# Patient Record
Sex: Female | Born: 1970 | Race: White | Hispanic: No | Marital: Married | Smoking: Never smoker
Health system: Southern US, Community
[De-identification: ages and names within clinical notes are randomized; demographics above are authoritative.]

## PROBLEM LIST (undated history)

## (undated) DIAGNOSIS — T7840XA Allergy, unspecified, initial encounter: Secondary | ICD-10-CM

## (undated) DIAGNOSIS — J45909 Unspecified asthma, uncomplicated: Secondary | ICD-10-CM

## (undated) DIAGNOSIS — F32A Depression, unspecified: Secondary | ICD-10-CM

## (undated) DIAGNOSIS — F329 Major depressive disorder, single episode, unspecified: Secondary | ICD-10-CM

## (undated) DIAGNOSIS — L209 Atopic dermatitis, unspecified: Secondary | ICD-10-CM

## (undated) DIAGNOSIS — F419 Anxiety disorder, unspecified: Secondary | ICD-10-CM

## (undated) HISTORY — DX: Anxiety disorder, unspecified: F41.9

## (undated) HISTORY — DX: Depression, unspecified: F32.A

## (undated) HISTORY — DX: Allergy, unspecified, initial encounter: T78.40XA

## (undated) HISTORY — DX: Atopic dermatitis, unspecified: L20.9

## (undated) HISTORY — DX: Unspecified asthma, uncomplicated: J45.909

---
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
## 1998-08-09 ENCOUNTER — Other Ambulatory Visit: Admission: RE | Admit: 1998-08-09 | Discharge: 1998-08-09 | Payer: Self-pay | Admitting: *Deleted

## 1999-07-29 ENCOUNTER — Other Ambulatory Visit: Admission: RE | Admit: 1999-07-29 | Discharge: 1999-07-29 | Payer: Self-pay | Admitting: *Deleted

## 2000-08-04 ENCOUNTER — Other Ambulatory Visit: Admission: RE | Admit: 2000-08-04 | Discharge: 2000-08-04 | Payer: Self-pay | Admitting: *Deleted

## 2000-12-10 ENCOUNTER — Encounter: Payer: Self-pay | Admitting: Obstetrics and Gynecology

## 2000-12-10 ENCOUNTER — Ambulatory Visit (HOSPITAL_COMMUNITY): Admission: RE | Admit: 2000-12-10 | Discharge: 2000-12-10 | Payer: Self-pay | Admitting: Obstetrics and Gynecology

## 2001-05-16 ENCOUNTER — Inpatient Hospital Stay: Admission: AD | Admit: 2001-05-16 | Discharge: 2001-05-16 | Payer: Self-pay | Admitting: Obstetrics and Gynecology

## 2001-05-21 ENCOUNTER — Inpatient Hospital Stay (HOSPITAL_COMMUNITY): Admission: AD | Admit: 2001-05-21 | Discharge: 2001-05-25 | Payer: Self-pay | Admitting: Obstetrics & Gynecology

## 2001-05-26 ENCOUNTER — Encounter: Admission: RE | Admit: 2001-05-26 | Discharge: 2001-06-25 | Payer: Self-pay | Admitting: Obstetrics and Gynecology

## 2001-06-28 ENCOUNTER — Other Ambulatory Visit: Admission: RE | Admit: 2001-06-28 | Discharge: 2001-06-28 | Payer: Self-pay | Admitting: Obstetrics and Gynecology

## 2002-10-03 ENCOUNTER — Encounter: Payer: Self-pay | Admitting: Internal Medicine

## 2002-10-03 ENCOUNTER — Encounter: Admission: RE | Admit: 2002-10-03 | Discharge: 2002-10-03 | Payer: Self-pay | Admitting: Internal Medicine

## 2003-02-15 ENCOUNTER — Other Ambulatory Visit: Admission: RE | Admit: 2003-02-15 | Discharge: 2003-02-15 | Payer: Self-pay | Admitting: Obstetrics and Gynecology

## 2003-09-13 ENCOUNTER — Other Ambulatory Visit: Admission: RE | Admit: 2003-09-13 | Discharge: 2003-09-13 | Payer: Self-pay | Admitting: Obstetrics and Gynecology

## 2004-02-16 ENCOUNTER — Ambulatory Visit (HOSPITAL_COMMUNITY): Admission: RE | Admit: 2004-02-16 | Discharge: 2004-02-16 | Payer: Self-pay | Admitting: Obstetrics & Gynecology

## 2004-04-17 ENCOUNTER — Encounter (INDEPENDENT_AMBULATORY_CARE_PROVIDER_SITE_OTHER): Payer: Self-pay | Admitting: *Deleted

## 2004-04-17 ENCOUNTER — Inpatient Hospital Stay (HOSPITAL_COMMUNITY): Admission: AD | Admit: 2004-04-17 | Discharge: 2004-04-20 | Payer: Self-pay | Admitting: Obstetrics and Gynecology

## 2004-05-17 ENCOUNTER — Other Ambulatory Visit: Admission: RE | Admit: 2004-05-17 | Discharge: 2004-05-17 | Payer: Self-pay | Admitting: Obstetrics and Gynecology

## 2010-07-23 ENCOUNTER — Encounter: Admission: RE | Admit: 2010-07-23 | Discharge: 2010-07-23 | Payer: Self-pay | Admitting: *Deleted

## 2011-01-05 ENCOUNTER — Encounter: Payer: Self-pay | Admitting: Obstetrics and Gynecology

## 2011-05-02 NOTE — H&P (Signed)
[facility] of [facility]  Patient:    Jacqueline Lane, Jacqueline Lane                  MRN: [id number] Adm. Date:  [id number] Attending:  Genia Del                         History and Physical  DATE OF BIRTH:                [birth year]  HISTORY:                      Mrs. Jacqueline Lane is a 40 year old G1 at 80 weeks and 4 days gestation.  Last menstrual period did not correspond with ultrasound. Dating by ultrasound expected date of delivery May 31, 2001.  REASON FOR ADMISSION:         Fluid leak at 3:15 a.m. on May 21, 2001.  HISTORY OF PRESENT ILLNESS:   Patient felt a large amount of clear fluid vaginally at 3:15 a.m. and that continued thereafter.  She felt mild irregular uterine contractions.  Fetal movements positive.  No vaginal bleeding.  No PIH symptoms.  PAST MEDICAL HISTORY:         Negative.  PAST SURGICAL HISTORY:        Negative.  PAST GYNECOLOGICAL HISTORY:   Negative.  FAMILY HISTORY:               Noncontributory.  MEDICATIONS:                  Prenate vitamins.  ALLERGIES:                    No known drug allergies.  SOCIAL HISTORY:               Married.  Nonsmoker.  HISTORY OF PRESENT PREGNANCY: First trimester was normal.  PRENATAL LABORATORIES:        Hemoglobin 13.3, platelets 271,000.  Blood group B+.  Rh antibody negative.  Toxo negative.  RPR negative.  HBSAG negative. HIV negative.  Rubella immune.  Pap test within normal limits.  Gonorrhea and chlamydia negative.  In the second trimester she had a triple test within normal limits.  In the third trimester a one hour GTT was within normal limits at 28 weeks.  She had a negative group B strep at 35 weeks.  Blood pressures remained normal throughout pregnancy.                                At 20+ weeks she had a review of anatomy that was within normal limits.  Normal placenta and fluids.  An ultrasound was repeated at 37+ weeks showing an estimated fetal weight at the  54th percentile.  Amniotic fluid was at the 93rd percentile.  Dopplers were normal.  REVIEW OF SYSTEMS:            CONSTITUTIONAL:  Negative.  HEENT:  Negative. RESPIRATORY:  Negative.  CARDIOVASCULAR:  Negative.  GENITOURINARY:  Negative. GASTROINTESTINAL:  Negative.  DERMATOLOGIC:  Negative.  NEUROLOGIC:  Negative. ENDOCRINOLOGIC:  Negative.  PHYSICAL EXAMINATION  GENERAL:                      No apparent distress.  VITAL SIGNS:  Blood pressure on admission 143/79, then 141/66, pulse 56, temperature 97.9, respiratory rate normal and regular.  LUNGS:                        Clear.  CARDIAC:                      Regular rhythm, no murmur.  ABDOMEN:                      Gravid.  Uterine height corresponding.  Cephalic presentation.  PELVIC:                       Vaginal examination by nurse showed a fingertip cervix, vertex, high station.  Clear fluid with positive fern.  EXTREMITIES:                  Lower limbs:  No edema.  LABORATORIES:                 Monitoring:  Fetal heart rate 140-150 with good variability, no decelerations.  Uterine contractions were irregular and mild.  IMPRESSION:                   G1 at 38 weeks and 4 days gestation with spontaneous rupture of membranes with no active labor currently.  Fetal well-being reassuring.  Group B strep negative.  PLAN:                         Admit to labor and delivery.  Start Pitocin induction.  Monitoring.  Ampicillin IV starting at 3:15 p.m.  Expectant management towards probable vaginal delivery. DD:  05/21/01 TD:  05/21/01 Job: [id number]

## 2011-05-02 NOTE — Op Note (Signed)
[facility] of [facility]  Patient:    Jacqueline Lane, Jacqueline Lane                  MRN: [id number] Proc. Date: 05/22/01 Adm. Date:  [id number] Attending:  Genia Del                           Operative Report  PREOPERATIVE DIAGNOSES:       1. Intrauterine pregnancy at 38 weeks 5 days.                               2. Prolonged rupture of membranes.                               3. Failure of induction.  POSTOPERATIVE DIAGNOSES:      1. Intrauterine pregnancy at 38 weeks 5 days.                               2. Prolonged rupture of membranes.                               3. Failure of induction.  PROCEDURE:                    Primary low transverse cesarean section.  SURGEON:                      Silverio Lay, M.D.  ANESTHESIA:                   Epidural.  ESTIMATED BLOOD LOSS:         600 cc.  DESCRIPTION OF PROCEDURE:     After being informed of the planned procedure with possible complications including bleeding, infection, injury to bowels, bladder, and ureters, informed consent is obtained. The patient is taken to OR#1 and preexisting epidural is reinforced. She is placed in the dorsal decubitus position with pelvis tilted to the left, prepped and draped in a sterile fashion, and a Foley catheter was already in place. After assessing adequate level of anesthesia, skin, subcutaneous tissue, and fat were incised in a Pfannenstiel fashion using knife. The fascia is incised in a transverse fashion. Linea alba is dissected. Peritoneum is entered in a midline fashion. Visceral peritoneum is then incised in the low transverse fashion allowing [place] to develop bladder flap and safely retract bladder. We can then safely enter myometrium in a low transverse fashion, first with knife, then in a blunt dissection. Amniotic fluid is clear. We assist the birth of a female infant at 10:31 a.m. in a vertex right ROA presentation. Mouth and nose are suctioned. Cord is  clamped with two Kelly clamps. Cord is sectioned and baby is given to pediatrician present in the room. Twenty cc of blood is drawn from the umbilical vein and we await the spontaneous delivery of placenta which is complete and cord has three vessels. Uterine revision is negative. We then proceed with closure of the hysterotomy in two layers, first with a running lock suture of 0 Vicryl, then with a Lemberts suture of 0 Vicryl covering the first one. We verify hemostasis which is adequate. We then clean both paracolic gutters, assess both tubes and  ovaries which are normal, and reassess hemostasis which is still adequate. Under fascia hemostasis is completed with cautery and fascia is closed with two running sutures of 0 Vicryl meeting midline. Fat is irrigated with warm saline. Hemostasis is completed with cautery. Incision is infiltrated with 10 cc of Marcaine 0.25% and skin is closed with staples.  Instruments and sponge count is complete x 2. Estimated blood loss 600 cc. The baby weighs 7 pounds 14 ounces and received Apgars of 9 at one minute and 9 at five minutes. The procedure is very well tolerated by the patient who is taken to recovery room in a well and stable condition. DD:  05/22/01 TD:  05/22/01 Job: [id number]

## 2011-05-02 NOTE — H&P (Signed)
NAME:  Jacqueline Lane, Jacqueline Lane NO.:  [id number]   MEDICAL RECORD NO.:  [id number]                   PATIENT TYPE:  INP   LOCATION:  9171                                 FACILITY:  [facility]   PHYSICIAN:  Lenoard Aden, M.D.             DATE OF BIRTH:  Jun 11, 1971   DATE OF ADMISSION:  04/17/2004  DATE OF DISCHARGE:                                HISTORY & PHYSICAL   CHIEF COMPLAINT:  Spontaneous rupture of membranes at 6:45 a.m.   HISTORY OF PRESENT ILLNESS:  The patient is a 40 year old, white female, G3,  P1, EDD of Apr 18, 2004, at 39-6/7 weeks who presents with spontaneous  rupture of thin meconium fluid for attempted VBAC.   ALLERGIES:  No known drug allergies.   MEDICATIONS:  Prenatal vitamins.   PAST OBSTETRICAL HISTORY:  C-section at 38 weeks for a 7 pound 13 ounce female  in 2002.  A four-week SAB in 2004.   PAST MEDICAL HISTORY:  History of C-section with no other medical or  surgical hospitalizations.   FAMILY HISTORY:  Prostate cancer.  Skin cancer.  Depression.  Heart disease.   PRENATAL LABORATORY DATA:  Blood type B positive.  Rh antibody negative.  Rubella immune.  Hepatitis and HIV negative.  GBS reported to be negative.   PHYSICAL EXAMINATION:  GENERAL:  Well-developed, well-nourished, white  female in no acute distress.  HEENT:  Normal.  LUNGS:  Clear.  HEART:  Regular rate and rhythm.  ABDOMEN:  Soft, gravid and nontender.  Estimated fetal weight 7-1/2 pounds.  PELVIC:  Cervix 2 cm, 90% vertex and -1.  Thin meconium stained fluid noted.  EXTREMITIES:  No cords.  NEUROLOGIC:  Nonfocal.   IMPRESSION:  Term intrauterine pregnancy with previous cesarean section for  attempted vaginal birth after cesarean.   PLAN:  The risks and benefits were discussed with a 1-3% risk of uterine  rupture noted, possibility of Pitocin augmentation noted, meconium rupture  of membranes with possibility of meconium aspiration discussed.  The patient  acknowledges and wishes to proceed with attempt of VBAC.  Will proceed with  amnioinfusion and anticipate attempts at vaginal delivery.                                              Lenoard Aden, M.D.   RJT/MEDQ  D:  04/17/2004  T:  04/17/2004  Job:  [id number]

## 2011-05-02 NOTE — Discharge Summary (Signed)
[facility] of [facility]  Patient:    Jacqueline Lane, Jacqueline Lane                  MRN: [id number] Adm. Date:  [id number] Disc. Date: [id number] Attending:  Genia Del                           Discharge Summary  REASON FOR ADMISSION:         Intrauterine pregnancy at 38 weeks and 4 days, reporting spontaneous rupture of membranes.  HISTORY OF PRESENT ILLNESS:   This is a 40 year old gravida 1 at 38 weeks and 4 days with spontaneous rupture of membranes who was started on Pitocin for a lack of labor.  Her cervix at the beginning of Pitocin was 1 cm, 95%, vertex, -2.  Twelve hours after spontaneous rupture of membranes her vaginal examination revealed a 3 cm cervix, 95% effacement, vertex, -1.  An intrauterine pressure catheter was inserted at 4 p.m.  Pitocin was increased until 36 milliunits/minute with adequate [place] units between 170 and 200 with no change on vaginal examination.  Trial of labor persisted for 24 hours with attempt to reduce the dose of Pitocin and reaugment, always to maintain MVUs above 150 and her cervical examination by May 22, 2001 at 9 a.m. remained unchanged at 3 cm, 95%, vertex, -1.  We concluded to failure of induction and patient was consented for primary low transverse cesarean section.  She underwent primary low transverse cesarean section for the birth of a female infant born at 10:31 on May 22, 2001 with Apgars 9 and 9 weighing 7 pounds 14 ounces without any complication and an estimated blood loss of 600 cc.  Her postoperative course was uneventful and she received her discharge home on May 25, 2001 or postoperative day #3 in a well and stable condition.  She was given Motrin and Tylox for pain control and was asked to come back in the office in four to six weeks for her postpartum examination.  She was instructed to call if experiencing increased pain, increased bleeding, or fever.  FINAL DIAGNOSES:              Intrauterine  pregnancy at 38 weeks and 5 days, failure of induction, prolonged rupture of membranes, status post primary low transverse cesarean section.  CONDITION ON DISCHARGE:       Well and stable.DD:  06/21/01 TD:  06/21/01 Job: [id number]

## 2011-05-02 NOTE — Op Note (Signed)
NAME:  Jacqueline Lane, Jacqueline Lane                     ACCOUNT NO.:  [id number]   MEDICAL RECORD NO.:  [id number]                   PATIENT TYPE:  INP   LOCATION:  9147                                 FACILITY:  [facility]   PHYSICIAN:  Gerri Spore B. Earlene Plater, M.D.               DATE OF BIRTH:  12/22/1970   DATE OF PROCEDURE:  04/17/2004  DATE OF DISCHARGE:                                 OPERATIVE REPORT   PREOPERATIVE DIAGNOSIS:  Previous cesarean section, attempting vaginal birth  after cesarean section with failure to progress beyond 8 cm.   POSTOPERATIVE DIAGNOSIS:  Previous cesarean section, attempting vaginal  birth after cesarean section with failure to progress beyond 8 cm.   PROCEDURE:  Repeat low transverse cesarean section.   SURGEON:  Chester Holstein. Earlene Plater, M.D.   ASSISTANT:  Richardean Sale, M.D.   FINDINGS:  Significant adhesions of the bladder to the posterior fascial  sheath, viable female infant weight 8 pounds 8 ounces, LOT position, Apgars  8 and 9, arterial pH 7.29, and meconium stained fluid.   ESTIMATED BLOOD LOSS:  700 mL.   FLUIDS:  2000.   URINE:  275.   COMPLICATIONS:  None.   INDICATIONS FOR PROCEDURE:  The patient presented in spontaneous labor with  history of previous cesarean section for failure to progress beyond 3 cm.  She was also noted to have meconium stained at amniotomy.  She had been  progressing well until 8 cm.  It did not change essentially beyond this for  greater than four hours despite adequate contractions on IUPC.  I therefore  recommended C-section.   DESCRIPTION OF PROCEDURE:  The patient taken to the operating room with  epidural anesthesia in place.  She was prepped and draped in standard  fashion and the bladder was being drained with a Foley catheter.  Incision  was made through the previous Pfannenstiel incision and carried sharply to  the fascia.  The fascia was divided sharply and extended laterally with Mayo  scissors.  Kocher clamps used to  elevate the superior aspect of the  incision.  The underlying rectus muscles were dissected off sharply.  Peritoneum approached in similar fashion.  Midline was identified and  divided sharply.  There was obviously bladder adherent to the posterior  sheath and cystotomy had not occurred.  The dissection of the superior  aspect of the fascia was continued cephalad another 2 to 3 cm to get clear  of the bladder adhesions.  The posterior sheath and peritoneum were then  entered sharply.  the bladder flap was then created with sharp and blunt  technique after taking down the adhesions to the uterus.  The rectus muscles  were divided in their medial third to provide additional room for delivery  as the abdomen was still quite tight.   Bladder blade was inserted and bladder flap created sharply.  The incision  was made in the uterus in low transverse fashion with  a knife and extended  laterally with bandage scissors.   The infant's head was delivered through the incision with the assistance of  a kiwi.  No pop-offs were encountered after it had been placed on the  midline in the flexion point from the mid green zone.  The nose and mouth  were suctioned with the DeLee and the remainder of the infant was delivered  without difficulty.  The cord was clamped and cut and the infant was handed  off to the awaiting pediatricians.  There was 1 g of Ancef given at the cord  clamp.   The placenta was removed manually and cleared of all clots and debris and  exteriorized.  The incision was free of extension.   The uterine incision was closed in a running locked fashion with 0 Monocryl.  There were some bleeders on the right lateral side which were made  hemostatic with figure-of-eight stitch of the same suture.   Uterus was returned to the abdomen and the pelvis irrigated.  The incision  was hemostatic as was the bladder flap and subfascial space.   The fascia was closed in a running stitch of 0  Vicryl.  The subcutaneous  tissue was irrigated and made hemostatic with the Bovie.  the skin weakness  closed with staples.   The patient tolerated the procedure well.  There were no complications.  She  was taken to the recovery room awake, alert and in stable condition.  All  counts correct per the operating room staff.                                               Gerri Spore B. Earlene Plater, M.D.    WBD/MEDQ  D:  04/17/2004  T:  04/18/2004  Job:  [id number]

## 2011-05-02 NOTE — Discharge Summary (Signed)
NAME:  Jacqueline Lane, Jacqueline Lane                     ACCOUNT NO.:  [id number]   MEDICAL RECORD NO.:  [id number]                   PATIENT TYPE:  INP   LOCATION:  9147                                 [facility]ACILITY[facility]   PHYSICIAN:  Gerri Spore B. Earlene Plater, M.D.               DATE OF BIRTH:  06-Jan-1971   DATE OF ADMISSION:  04/17/2004  DATE OF DISCHARGE:  04/20/2004                                 DISCHARGE SUMMARY   ADMISSION DIAGNOSES:  1. Forty-week intrauterine pregnancy.  2. Previous cesarean section, attempting vaginal birth after cesarean     section.  3. Failure to progress beyond 8 cm.   DISCHARGE DIAGNOSES:  1. Forty-week intrauterine pregnancy.  2. Previous cesarean section, attempting vaginal birth after cesarean     section.  3. Failure to progress beyond 8 cm.   PROCEDURE:  Repeat low transverse cesarean section.   HISTORY OF PRESENT ILLNESS:  The patient presented in spontaneous labor with  a history of a previous cesarean section for failure to progress beyond 3  cm.   HOSPITAL COURSE:  The patient was admitted in spontaneous labor and  progressed well to 8 cm.  There was no cervical change beyond 8 cm for  greater than 4 hours despite adequate contractions on IUPC.  I therefore  recommended cesarean section.   The patient was subsequently delivered by a repeat low transverse cesarean  section.  Findings at the time of surgery included significant adhesions of  the bladder to the posterior fascial sheath.  A viable female infant, weight  8 pounds 8 ounces, LOP position, Apgars 8 and 9, arterial cord pH 7.29, and  meconium-stained fluid.   Postoperatively the patient rapidly regained her ability to ambulate, void,  and tolerate a regular diet.  She was discharged home on postoperative day  #3.   DISCHARGE INSTRUCTIONS:  Standard preprinted instructions given prior to  dismissal.   DISCHARGE MEDICATIONS:  Tylox one to two p.o. q.4-6h. p.r.n. for pain.   FOLLOW-UP:  [facility] in 4-6 weeks.   DISPOSITION AT DISCHARGE:  Satisfactory.                                               Gerri Spore B. Earlene Plater, M.D.   WBD/MEDQ  D:  05/01/2004  T:  05/01/2004  Job:  [id number]

## 2020-03-19 ENCOUNTER — Encounter: Payer: Self-pay | Admitting: Internal Medicine

## 2020-04-06 ENCOUNTER — Other Ambulatory Visit: Payer: Self-pay

## 2020-04-06 ENCOUNTER — Ambulatory Visit (AMBULATORY_SURGERY_CENTER): Payer: Self-pay | Admitting: *Deleted

## 2020-04-06 VITALS — Temp 97.0°F | Ht 62.0 in | Wt 136.4 lb

## 2020-04-06 DIAGNOSIS — Z1211 Encounter for screening for malignant neoplasm of colon: Secondary | ICD-10-CM

## 2020-04-06 MED ORDER — SUTAB 1479-225-188 MG PO TABS: 1.0000 | ORAL_TABLET | ORAL | 0 refills | Status: DC

## 2020-04-06 NOTE — Progress Notes (Signed)
Patient denies any allergies to egg or soy products. Patient denies complications with anesthesia/sedation.  Patient denies oxygen use at home and denies diet medications. Emmi instructions for colonoscopy explained and given to patient.  Sutab coupon given at [facility] appt.  Patient had both covid vaccinations, last one on 03/31/20

## 2020-04-18 ENCOUNTER — Encounter: Payer: Self-pay | Admitting: Internal Medicine

## 2020-04-20 ENCOUNTER — Ambulatory Visit (AMBULATORY_SURGERY_CENTER): Payer: 59 | Admitting: Internal Medicine

## 2020-04-20 ENCOUNTER — Other Ambulatory Visit: Payer: Self-pay | Admitting: Internal Medicine

## 2020-04-20 ENCOUNTER — Encounter: Payer: Self-pay | Admitting: Internal Medicine

## 2020-04-20 ENCOUNTER — Other Ambulatory Visit: Payer: Self-pay

## 2020-04-20 VITALS — BP 115/64 | HR 49 | Temp 96.1°F | Resp 17 | Ht 62.0 in | Wt 136.0 lb

## 2020-04-20 DIAGNOSIS — Z1211 Encounter for screening for malignant neoplasm of colon: Secondary | ICD-10-CM | POA: Diagnosis present

## 2020-04-20 DIAGNOSIS — D124 Benign neoplasm of descending colon: Secondary | ICD-10-CM

## 2020-04-20 MED ORDER — SODIUM CHLORIDE 0.9 % IV SOLN: 500.0000 mL | Freq: Once | INTRAVENOUS | Status: DC

## 2020-04-20 NOTE — Patient Instructions (Signed)
HANDOUTS PROVIDED ON: POLYPS & HEMORRHOIDS  The polyp removed today have been sent for pathology.  The results can take 1-3 weeks to receive.  When your next colonoscopy should occur will be based on the pathology results.    You may resume your previous diet and medication schedule.  Thank you for allowing us to care for you today!!!   YOU HAD AN ENDOSCOPIC PROCEDURE TODAY AT THE [facility]:   Refer to the procedure report that was given to you for any specific questions about what was found during the examination.  If the procedure report does not answer your questions, please call your gastroenterologist to clarify.  If you requested that your care partner not be given the details of your procedure findings, then the procedure report has been included in a sealed envelope for you to review at your convenience later.  YOU SHOULD EXPECT: Some feelings of bloating in the abdomen. Passage of more gas than usual.  Walking can help get rid of the air that was put into your GI tract during the procedure and reduce the bloating. If you had a lower endoscopy (such as a colonoscopy or flexible sigmoidoscopy) you may notice spotting of blood in your stool or on the toilet paper. If you underwent a bowel prep for your procedure, you may not have a normal bowel movement for a few days.  Please Note:  You might notice some irritation and congestion in your nose or some drainage.  This is from the oxygen used during your procedure.  There is no need for concern and it should clear up in a day or so.  SYMPTOMS TO REPORT IMMEDIATELY:   Following lower endoscopy (colonoscopy or flexible sigmoidoscopy):  Excessive amounts of blood in the stool  Significant tenderness or worsening of abdominal pains  Swelling of the abdomen that is new, acute  Fever of 100F or higher  For urgent or emergent issues, a gastroenterologist can be reached at any hour by calling [phone]. Do not use MyChart  messaging for urgent concerns.    DIET:  We do recommend a small meal at first, but then you may proceed to your regular diet.  Drink plenty of fluids but you should avoid alcoholic beverages for 24 hours.  ACTIVITY:  You should plan to take it easy for the rest of today and you should NOT DRIVE or use heavy machinery until tomorrow (because of the sedation medicines used during the test).    FOLLOW UP: Our staff will call the number listed on your records 48-72 hours following your procedure to check on you and address any questions or concerns that you may have regarding the information given to you following your procedure. If we do not reach you, we will leave a message.  We will attempt to reach you two times.  During this call, we will ask if you have developed any symptoms of COVID 19. If you develop any symptoms (ie: fever, flu-like symptoms, shortness of breath, cough etc.) before then, please call [phone].  If you test positive for Covid 19 in the 2 weeks post procedure, please call and report this information to us.    If any biopsies were taken you will be contacted by phone or by letter within the next 1-3 weeks.  Please call us at [phone] if you have not heard about the biopsies in 3 weeks.    SIGNATURES/CONFIDENTIALITY: You and/or your care partner have signed paperwork which will be   entered into your electronic medical record.  These signatures attest to the fact that that the information above on your After Visit Summary has been reviewed and is understood.  Full responsibility of the confidentiality of this discharge information lies with you and/or your care-partner. 

## 2020-04-20 NOTE — Progress Notes (Signed)
Called to room to assist during endoscopic procedure.  Patient ID and intended procedure confirmed with present staff. Received instructions for my participation in the procedure from the performing physician.  

## 2020-04-20 NOTE — Op Note (Signed)
[facility] Patient Name: Jacqueline Lane Procedure Date: 04/20/2020 1:14 PM MRN: [id number] Endoscopist: Jerene Bears , MD Age: 49 Referring MD:  Date of Birth: 1971-03-31 Gender: Female Account #: [id number] Procedure:                Colonoscopy Indications:              Screening for colorectal malignant neoplasm, This                            is the patient's first colonoscopy Medicines:                Propofol per Anesthesia Procedure:                Pre-Anesthesia Assessment:                           - Prior to the procedure, a History and Physical                            was performed, and patient medications and                            allergies were reviewed. The patient's tolerance of                            previous anesthesia was also reviewed. The risks                            and benefits of the procedure and the sedation                            options and risks were discussed with the patient.                            All questions were answered, and informed consent                            was obtained. Prior Anticoagulants: The patient has                            taken no previous anticoagulant or antiplatelet                            agents. ASA Grade Assessment: II - A patient with                            mild systemic disease. After reviewing the risks                            and benefits, the patient was deemed in                            satisfactory condition to undergo the procedure.  After obtaining informed consent, the colonoscope                            was passed under direct vision. Throughout the                            procedure, the patient's blood pressure, pulse, and                            oxygen saturations were monitored continuously. The                            Colonoscope was introduced through the anus and                            advanced to the cecum,  identified by appendiceal                            orifice and ileocecal valve. The colonoscopy was                            performed without difficulty. The patient tolerated                            the procedure well. The quality of the bowel                            preparation was excellent. The ileocecal valve,                            appendiceal orifice, and rectum were photographed. Scope In: 1:19:35 PM Scope Out: 1:34:04 PM Scope Withdrawal Time: 0 hours 9 minutes 41 seconds  Total Procedure Duration: 0 hours 14 minutes 29 seconds  Findings:                 The digital rectal exam was normal.                           A 5 mm polyp was found in the descending colon. The                            polyp was sessile. The polyp was removed with a                            cold snare. Resection and retrieval were complete.                           Internal hemorrhoids and hypertrophied anal papilla                            were found during retroflexion. The hemorrhoids                            were small. Complications:  No immediate complications. Estimated Blood Loss:     Estimated blood loss was minimal. Impression:               - One 5 mm polyp in the descending colon, removed                            with a cold snare. Resected and retrieved.                           - Small internal hemorrhoids. Recommendation:           - Patient has a contact number available for                            emergencies. The signs and symptoms of potential                            delayed complications were discussed with the                            patient. Return to normal activities tomorrow.                            Written discharge instructions were provided to the                            patient.                           - Resume previous diet.                           - Continue present medications.                           - Await  pathology results.                           - Repeat colonoscopy is recommended. The                            colonoscopy date will be determined after pathology                            results from today's exam become available for                            review. Jerene Bears, MD 04/20/2020 1:37:21 PM This report has been signed electronically.

## 2020-04-20 NOTE — Progress Notes (Signed)
Report to PACU, RN, vss, BBS= Clear.  

## 2020-04-20 NOTE — Progress Notes (Signed)
[facility] Temp-JB  Pt's states no medical or surgical changes since previsit or office visit. 

## 2020-04-24 ENCOUNTER — Telehealth: Payer: Self-pay | Admitting: *Deleted

## 2020-04-24 NOTE — Telephone Encounter (Signed)
  Follow up Call-  Call back number 04/20/2020  Post procedure Call Back phone  # [phone]  Permission to leave phone message Yes  Some recent data might be hidden     Patient questions:  Do you have a fever, pain , or abdominal swelling? No. Pain Score  0 *  Have you tolerated food without any problems? Yes.    Have you been able to return to your normal activities? Yes.    Do you have any questions about your discharge instructions: Diet   No. Medications  No. Follow up visit  No.  Do you have questions or concerns about your Care? No.  Actions: * If pain score is 4 or above: No action needed, pain <4.  1. Have you developed a fever since your procedure? no  2.   Have you had an respiratory symptoms (SOB or cough) since your procedure? no  3.   Have you tested positive for COVID 19 since your procedure no  4.   Have you had any family members/close contacts diagnosed with the COVID 19 since your procedure? no   If yes to any of these questions please route to Joylene John, RN and Erenest Rasher, RN

## 2020-04-25 ENCOUNTER — Encounter: Payer: Self-pay | Admitting: Internal Medicine

## 2021-11-19 ENCOUNTER — Other Ambulatory Visit: Payer: Self-pay | Admitting: Obstetrics

## 2021-11-19 DIAGNOSIS — N644 Mastodynia: Secondary | ICD-10-CM

## 2022-01-10 ENCOUNTER — Other Ambulatory Visit: Payer: Self-pay

## 2022-01-10 ENCOUNTER — Ambulatory Visit
Admission: RE | Admit: 2022-01-10 | Discharge: 2022-01-10 | Disposition: A | Discharge status: Home / Self Care | Payer: 59 | Source: Ambulatory Visit | Attending: Obstetrics | Admitting: Obstetrics

## 2022-01-10 ENCOUNTER — Other Ambulatory Visit: Payer: Self-pay | Admitting: Obstetrics

## 2022-01-10 DIAGNOSIS — N644 Mastodynia: Secondary | ICD-10-CM

## 2022-10-04 IMAGING — MG MM DIGITAL DIAGNOSTIC UNILAT*L* W/ TOMO W/ CAD
6 series · 6 of 18 positions shown · non-contrast
Comparison: Previous exam(s).

CLINICAL DATA: Palpable abnormality associated with tenderness in
the LEFT breast, getting worse since [REDACTED].

EXAM:
DIGITAL DIAGNOSTIC UNILATERAL LEFT MAMMOGRAM WITH TOMOSYNTHESIS AND
CAD; ULTRASOUND LEFT BREAST LIMITED
TECHNIQUE: Left digital diagnostic mammography and breast tomosynthesis was
performed. The images were evaluated with computer-aided detection.;
Targeted ultrasound examination of the left breast was performed.

[L CC synth-2D]
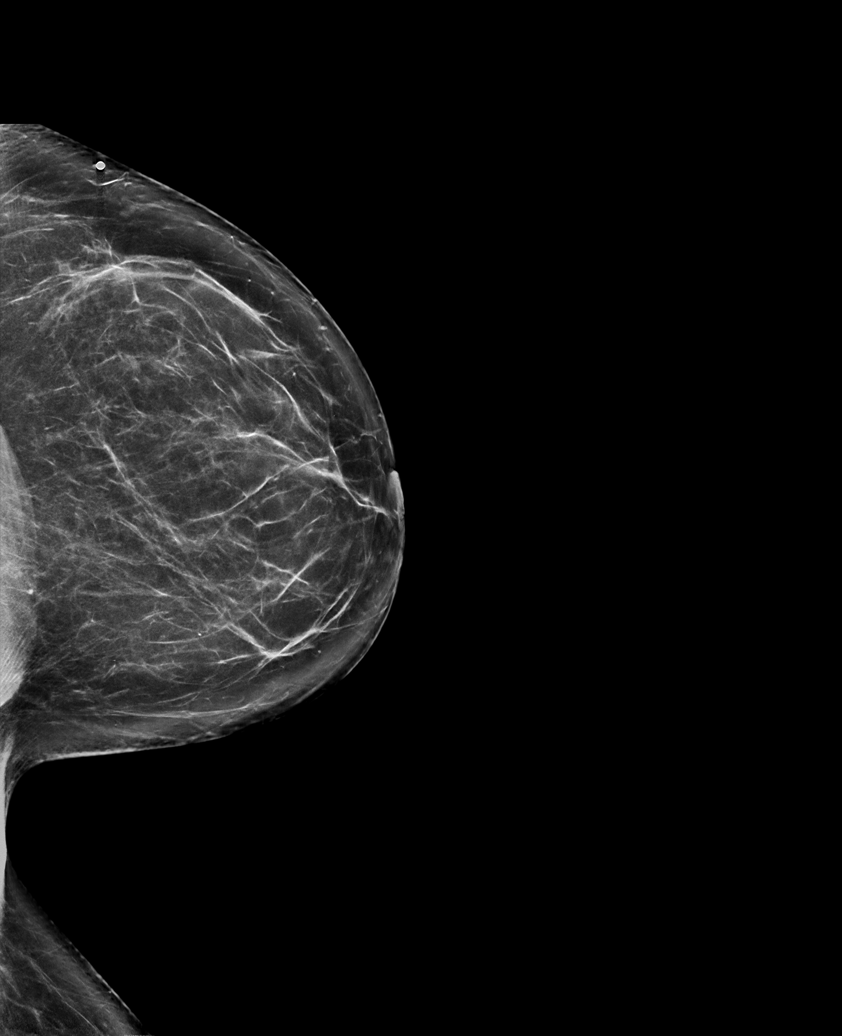

[L TAN synth-2D]
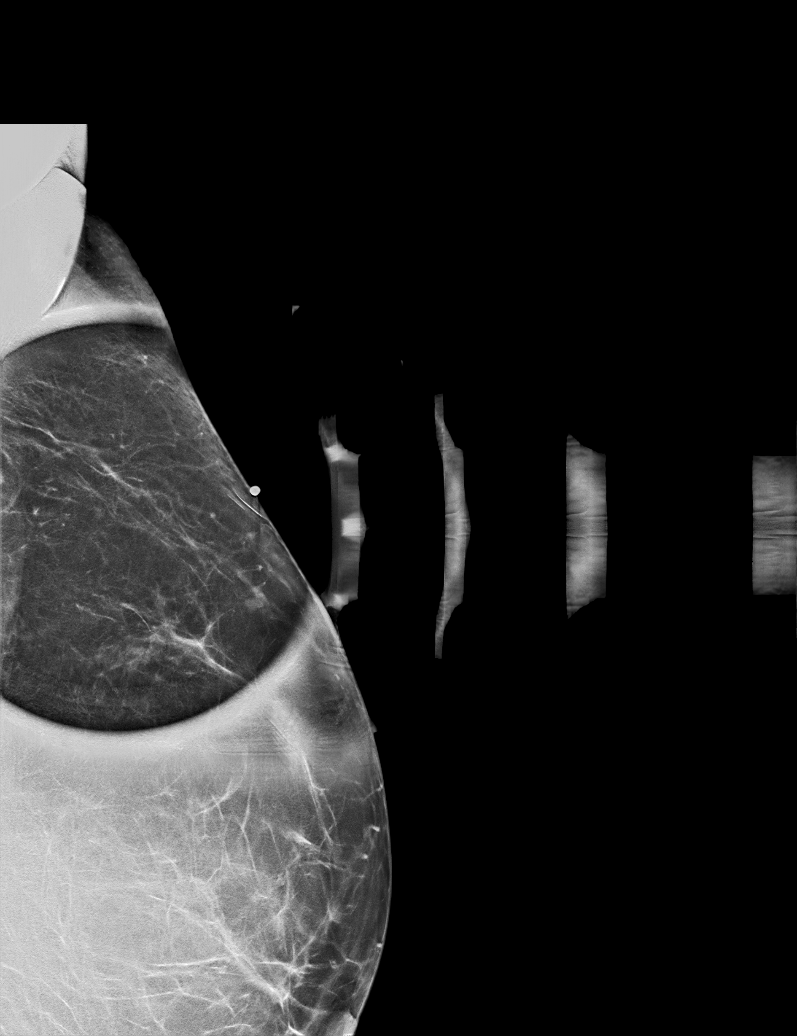

[L MLO synth-2D]
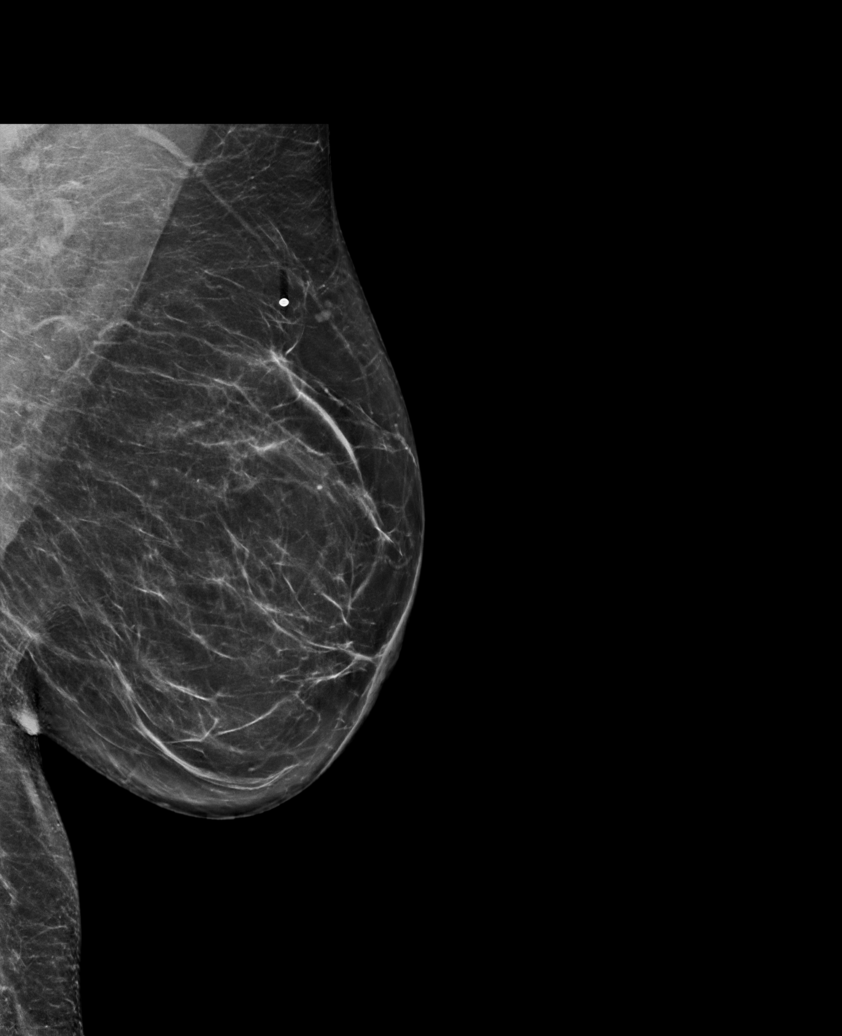

[L MLO tomo · tomo slice 39/76.0]
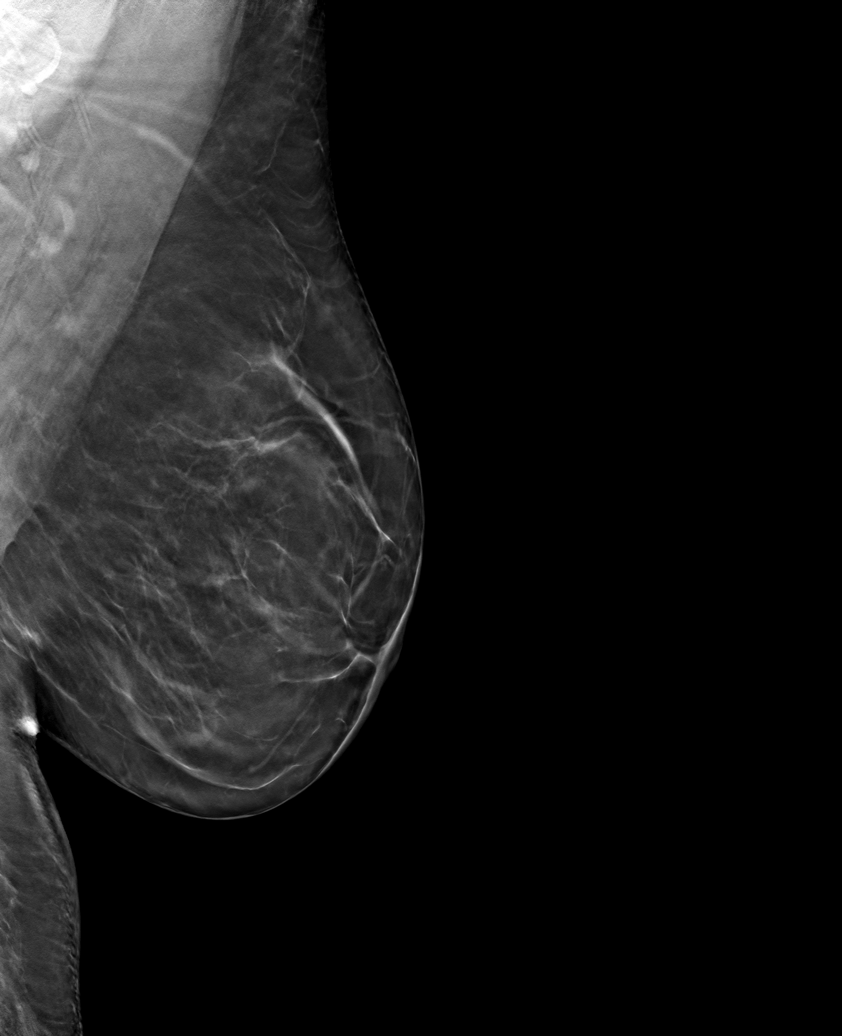

[L CC tomo · tomo slice 39/77.0]
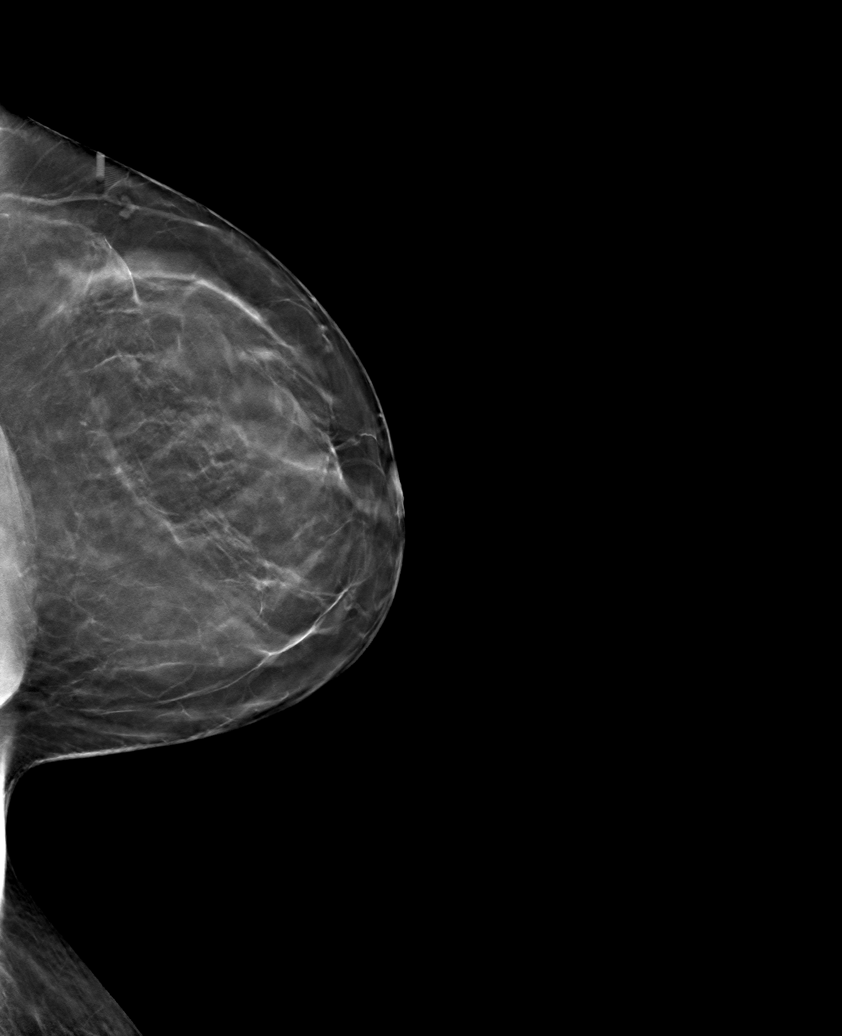

[L TAN tomo · tomo slice 32/63.0]
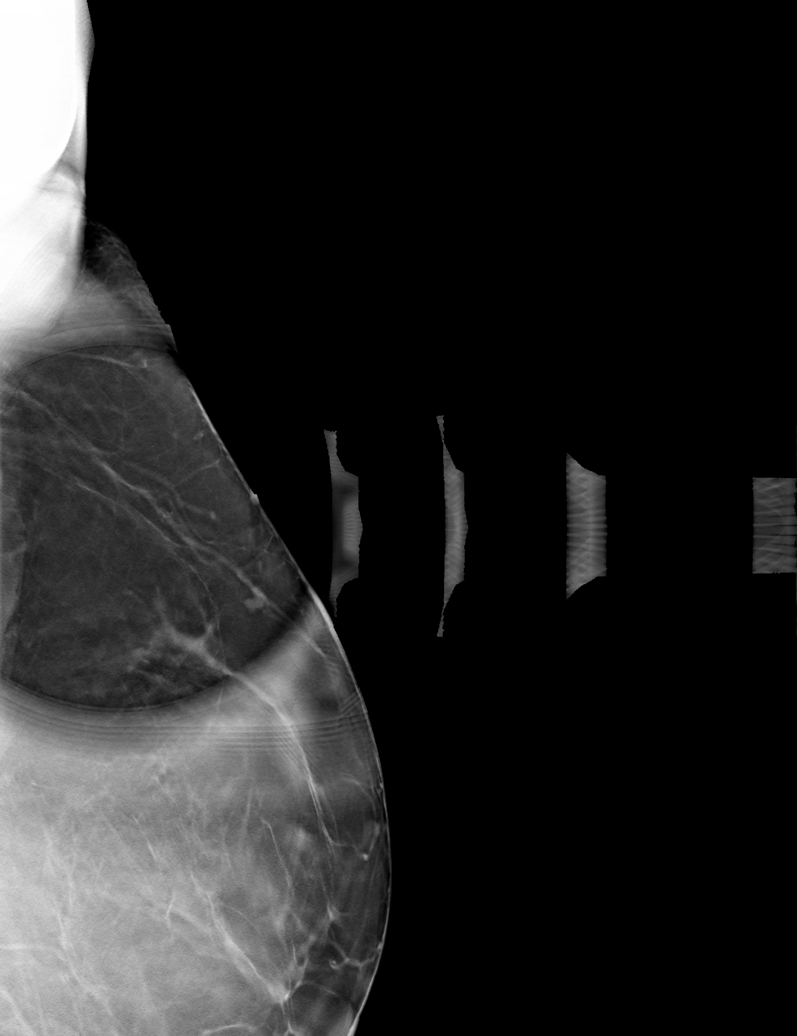

[6 of 18 positions shown; findings below may reference images not displayed]

ACR Breast Density Category b: There are scattered areas of
fibroglandular density.
FINDINGS: No suspicious mass, distortion, or microcalcifications are
identified to suggest presence of malignancy. Spot tangential view
in the area of patient's concern in the UPPER-OUTER QUADRANT shows
normal appearing fibroglandular tissue.

On physical exam, I palpate no discrete mass in the UPPER-OUTER
QUADRANT of the LEFT breast. Patient is mildly tender in this
region.

Targeted ultrasound is performed, showing normal appearing
fibrofatty tissue in the UPPER-OUTER QUADRANT of the LEFT breast. No
suspicious mass, distortion, or acoustic shadowing is demonstrated
with ultrasound.
IMPRESSION: No mammographic or ultrasound evidence for malignancy.

RECOMMENDATION:
Screening mammogram is recommended in January 2022.

I have discussed the findings and recommendations with the patient.
If applicable, a reminder letter will be sent to the patient
regarding the next appointment.

BI-RADS CATEGORY  1: Negative.
